# Patient Record
Sex: Female | Born: 1965 | Race: White | Hispanic: No | Smoking: Former smoker
Health system: Southern US, Community
[De-identification: ages and names within clinical notes are randomized; demographics above are authoritative.]

## PROBLEM LIST (undated history)

## (undated) DIAGNOSIS — I10 Essential (primary) hypertension: Secondary | ICD-10-CM

## (undated) DIAGNOSIS — M109 Gout, unspecified: Secondary | ICD-10-CM

---

## 2006-01-14 ENCOUNTER — Emergency Department (HOSPITAL_COMMUNITY): Admission: EM | Admit: 2006-01-14 | Discharge: 2006-01-14 | Payer: Self-pay | Admitting: *Deleted

## 2006-02-12 ENCOUNTER — Emergency Department (HOSPITAL_COMMUNITY): Admission: EM | Admit: 2006-02-12 | Discharge: 2006-02-13 | Payer: Self-pay | Admitting: Emergency Medicine

## 2007-01-04 ENCOUNTER — Emergency Department (HOSPITAL_COMMUNITY): Admission: EM | Admit: 2007-01-04 | Discharge: 2007-01-04 | Payer: Self-pay | Admitting: Emergency Medicine

## 2007-11-05 ENCOUNTER — Emergency Department (HOSPITAL_BASED_OUTPATIENT_CLINIC_OR_DEPARTMENT_OTHER): Admission: EM | Admit: 2007-11-05 | Discharge: 2007-11-05 | Payer: Self-pay | Admitting: Emergency Medicine

## 2008-01-18 ENCOUNTER — Emergency Department (HOSPITAL_BASED_OUTPATIENT_CLINIC_OR_DEPARTMENT_OTHER): Admission: EM | Admit: 2008-01-18 | Discharge: 2008-01-18 | Payer: Self-pay | Admitting: Emergency Medicine

## 2008-01-29 ENCOUNTER — Emergency Department (HOSPITAL_BASED_OUTPATIENT_CLINIC_OR_DEPARTMENT_OTHER): Admission: EM | Admit: 2008-01-29 | Discharge: 2008-01-29 | Payer: Self-pay | Admitting: Emergency Medicine

## 2009-04-14 ENCOUNTER — Ambulatory Visit (HOSPITAL_COMMUNITY): Admission: RE | Admit: 2009-04-14 | Discharge: 2009-04-14 | Payer: Self-pay | Admitting: Obstetrics and Gynecology

## 2009-07-08 ENCOUNTER — Emergency Department (HOSPITAL_BASED_OUTPATIENT_CLINIC_OR_DEPARTMENT_OTHER): Admission: EM | Admit: 2009-07-08 | Discharge: 2009-07-08 | Payer: Self-pay | Admitting: Emergency Medicine

## 2011-02-17 LAB — COMPREHENSIVE METABOLIC PANEL
Alkaline Phosphatase: 66
BUN: 8
Calcium: 10.1
Creatinine, Ser: 0.7
Glucose, Bld: 88
Potassium: 3.6
Total Protein: 9 — ABNORMAL HIGH

## 2011-02-17 LAB — LIPASE, BLOOD: Lipase: 77

## 2011-02-17 LAB — CBC
HCT: 32.8 — ABNORMAL LOW
Hemoglobin: 10.2 — ABNORMAL LOW
MCHC: 31
MCV: 75.1 — ABNORMAL LOW
Platelets: 595 — ABNORMAL HIGH
RDW: 17.3 — ABNORMAL HIGH

## 2011-02-17 LAB — DIFFERENTIAL
Basophils Relative: 1
Lymphocytes Relative: 33
Monocytes Relative: 7
Neutro Abs: 6.3
Neutrophils Relative %: 58

## 2011-02-26 LAB — CBC
HCT: 25.7 — ABNORMAL LOW
Platelets: 405 — ABNORMAL HIGH
RBC: 3.54 — ABNORMAL LOW
WBC: 10

## 2011-02-26 LAB — URINALYSIS, ROUTINE W REFLEX MICROSCOPIC
Glucose, UA: NEGATIVE
pH: 6

## 2011-02-26 LAB — I-STAT 8, (EC8 V) (CONVERTED LAB)
BUN: 5 — ABNORMAL LOW
Chloride: 102
Glucose, Bld: 96
HCT: 30 — ABNORMAL LOW
pCO2, Ven: 33.7 — ABNORMAL LOW
pH, Ven: 7.441 — ABNORMAL HIGH

## 2011-02-26 LAB — POCT CARDIAC MARKERS
CKMB, poc: <1 — ABNORMAL LOW
Myoglobin, poc: 35.3
Myoglobin, poc: 41.3
Operator id: 272551
Operator id: 272551
Troponin i, poc: <0.05

## 2011-02-26 LAB — DIFFERENTIAL
Lymphocytes Relative: 33
Lymphs Abs: 3.3
Neutrophils Relative %: 50

## 2011-02-26 LAB — OCCULT BLOOD X 1 CARD TO LAB, STOOL: Fecal Occult Bld: NEGATIVE

## 2011-02-26 LAB — PREGNANCY, URINE: Preg Test, Ur: NEGATIVE

## 2014-03-31 ENCOUNTER — Emergency Department (HOSPITAL_BASED_OUTPATIENT_CLINIC_OR_DEPARTMENT_OTHER)
Admission: EM | Admit: 2014-03-31 | Discharge: 2014-03-31 | Disposition: A | Discharge status: Home / Self Care | Payer: Self-pay | Attending: Emergency Medicine | Admitting: Emergency Medicine

## 2014-03-31 ENCOUNTER — Encounter (HOSPITAL_BASED_OUTPATIENT_CLINIC_OR_DEPARTMENT_OTHER): Payer: Self-pay | Admitting: Emergency Medicine

## 2014-03-31 ENCOUNTER — Emergency Department (HOSPITAL_BASED_OUTPATIENT_CLINIC_OR_DEPARTMENT_OTHER): Payer: Self-pay

## 2014-03-31 DIAGNOSIS — R059 Cough, unspecified: Secondary | ICD-10-CM

## 2014-03-31 DIAGNOSIS — J209 Acute bronchitis, unspecified: Secondary | ICD-10-CM | POA: Insufficient documentation

## 2014-03-31 DIAGNOSIS — Z87891 Personal history of nicotine dependence: Secondary | ICD-10-CM | POA: Insufficient documentation

## 2014-03-31 DIAGNOSIS — Z79899 Other long term (current) drug therapy: Secondary | ICD-10-CM | POA: Insufficient documentation

## 2014-03-31 DIAGNOSIS — J40 Bronchitis, not specified as acute or chronic: Secondary | ICD-10-CM

## 2014-03-31 DIAGNOSIS — R05 Cough: Secondary | ICD-10-CM

## 2014-03-31 DIAGNOSIS — I10 Essential (primary) hypertension: Secondary | ICD-10-CM | POA: Insufficient documentation

## 2014-03-31 HISTORY — DX: Essential (primary) hypertension: I10

## 2014-03-31 LAB — CBC WITH DIFFERENTIAL/PLATELET
BASOS ABS: 0.1 10*3/uL (ref 0.0–0.1)
BASOS PCT: 1 % (ref 0–1)
EOS ABS: 0.5 10*3/uL (ref 0.0–0.7)
Eosinophils Relative: 5 % (ref 0–5)
HCT: 32.4 % — ABNORMAL LOW (ref 36.0–46.0)
HEMOGLOBIN: 10.3 g/dL — AB (ref 12.0–15.0)
Lymphocytes Relative: 33 % (ref 12–46)
Lymphs Abs: 3 10*3/uL (ref 0.7–4.0)
MCH: 27.2 pg (ref 26.0–34.0)
MCHC: 31.8 g/dL (ref 30.0–36.0)
MCV: 85.5 fL (ref 78.0–100.0)
Monocytes Absolute: 0.9 10*3/uL (ref 0.1–1.0)
Monocytes Relative: 9 % (ref 3–12)
NEUTROS PCT: 52 % (ref 43–77)
Neutro Abs: 4.7 10*3/uL (ref 1.7–7.7)
Platelets: 327 10*3/uL (ref 150–400)
RBC: 3.79 MIL/uL — ABNORMAL LOW (ref 3.87–5.11)
RDW: 15.9 % — AB (ref 11.5–15.5)
WBC: 9.1 10*3/uL (ref 4.0–10.5)

## 2014-03-31 LAB — BASIC METABOLIC PANEL
ANION GAP: 18 — AB (ref 5–15)
BUN: 13 mg/dL (ref 6–23)
CO2: 26 mEq/L (ref 19–32)
Calcium: 9.5 mg/dL (ref 8.4–10.5)
Chloride: 96 mEq/L (ref 96–112)
Creatinine, Ser: 0.6 mg/dL (ref 0.50–1.10)
Glucose, Bld: 124 mg/dL — ABNORMAL HIGH (ref 70–99)
POTASSIUM: 3.4 meq/L — AB (ref 3.7–5.3)
SODIUM: 140 meq/L (ref 137–147)

## 2014-03-31 LAB — TROPONIN I

## 2014-03-31 LAB — D-DIMER, QUANTITATIVE: D-Dimer, Quant: <0.27 ug/mL-FEU (ref 0.00–0.48)

## 2014-03-31 MED ORDER — HYDROCODONE-HOMATROPINE 5-1.5 MG/5ML PO SYRP: 5.0000 mL | ORAL_SOLUTION | Freq: Four times a day (QID) | ORAL | Status: AC | PRN

## 2014-03-31 MED ORDER — AZITHROMYCIN 250 MG PO TABS: 250.0000 mg | ORAL_TABLET | Freq: Every day | ORAL | Status: AC

## 2014-03-31 MED ORDER — HYDROCODONE-HOMATROPINE 5-1.5 MG/5ML PO SYRP: 5.0000 mL | ORAL_SOLUTION | Freq: Four times a day (QID) | ORAL | Status: DC | PRN

## 2014-03-31 MED ORDER — AZITHROMYCIN 250 MG PO TABS: 250.0000 mg | ORAL_TABLET | Freq: Every day | ORAL | Status: DC

## 2014-03-31 MED ORDER — AZITHROMYCIN 250 MG PO TABS
500.0000 mg | ORAL_TABLET | Freq: Once | ORAL | Status: AC
  Administered 2014-03-31: 500 mg via ORAL
  Filled 2014-03-31: qty 2

## 2014-03-31 NOTE — Discharge Instructions (Signed)
Take azithromycin as directed until gone. Take hycodan as needed for cough. Refer to attached documents for more information. Return to the ED with worsening or concerning symptoms.

## 2014-03-31 NOTE — ED Provider Notes (Signed)
CSN: [id number]     Arrival date & time 03/31/14  1927 History   First MD Initiated Contact with Patient 03/31/14 2002     Chief Complaint  Patient presents with  . Cough  . Fever  . Generalized Body Aches     (Consider location/radiation/quality/duration/timing/severity/associated sxs/prior Treatment) HPI Comments: Patient is a 48 year old female with a past medical history of hypertension who presents with a 1 week history of cough. Symptoms started gradually and progressively worsened since the onset. The cough is productive with thick green sputum and sometime "blood clots." Patient reports associated body aches, shortness of breath, subjective fever, and a sharp pain in the left chest. The chest pain does not radiate and is made worse with deep inspiration. She has tried OTC medications without relief. No known sick contacts. Patient denies previous DVT/PE, exogenous estrogen, recent travel/surgery.    Past Medical History  Diagnosis Date  . Hypertension    History reviewed. No pertinent past surgical history. No family history on file. History  Substance Use Topics  . Smoking status: Former Games developermoker  . Smokeless tobacco: Not on file  . Alcohol Use: Not on file   OB History    No data available     Review of Systems  Constitutional: Negative for fever, chills and fatigue.  HENT: Positive for congestion. Negative for trouble swallowing.   Eyes: Negative for visual disturbance.  Respiratory: Positive for cough and shortness of breath.   Cardiovascular: Positive for chest pain. Negative for palpitations.  Gastrointestinal: Negative for nausea, vomiting, abdominal pain and diarrhea.  Genitourinary: Negative for dysuria and difficulty urinating.  Musculoskeletal: Negative for arthralgias and neck pain.  Skin: Negative for color change.  Neurological: Negative for dizziness and weakness.  Psychiatric/Behavioral: Negative for dysphoric mood.      Allergies  Review of  patient's allergies indicates no known allergies.  Home Medications   Prior to Admission medications   Medication Sig Start Date End Date Taking? Authorizing Provider  lisinopril-hydrochlorothiazide (PRINZIDE,ZESTORETIC) 20-25 MG per tablet Take 1 tablet by mouth daily.   Yes Historical Provider, MD   BP 134/68 mmHg  Pulse 90  Temp(Src) 98.3 F (36.8 C) (Oral)  Resp 20  Ht 5\' 3"  (1.6 m)  Wt 208 lb (94.348 kg)  BMI 36.85 kg/m2  SpO2 97%  LMP 03/24/2014 (Exact Date) Physical Exam  Constitutional: She is oriented to person, place, and time. She appears well-developed and well-nourished. No distress.  Patient coughing throughout exam.   HENT:  Head: Normocephalic and atraumatic.  Mouth/Throat: Oropharynx is clear and moist. No oropharyngeal exudate.  Eyes: Conjunctivae and EOM are normal.  Neck: Normal range of motion.  Cardiovascular: Normal rate and regular rhythm.  Exam reveals no gallop and no friction rub.   No murmur heard. Pulmonary/Chest: Effort normal and breath sounds normal. She has no wheezes. She has no rales. She exhibits no tenderness.  Abdominal: Soft. She exhibits no distension. There is no tenderness. There is no rebound.  Musculoskeletal: Normal range of motion.  Neurological: She is alert and oriented to person, place, and time. Coordination normal.  Speech is goal-oriented. Moves limbs without ataxia.   Skin: Skin is warm and dry.  Psychiatric: She has a normal mood and affect. Her behavior is normal.  Nursing note and vitals reviewed.   ED Course  Procedures (including critical care time) Labs Review Labs Reviewed  CBC WITH DIFFERENTIAL - Abnormal; Notable for the following:    RBC 3.79 (*)  Hemoglobin 10.3 (*)    HCT 32.4 (*)    RDW 15.9 (*)    All other components within normal limits  BASIC METABOLIC PANEL - Abnormal; Notable for the following:    Potassium 3.4 (*)    Glucose, Bld 124 (*)    Anion gap 18 (*)    All other components within  normal limits  D-DIMER, QUANTITATIVE  TROPONIN I    Imaging Review Dg Chest 2 View  03/31/2014   CLINICAL DATA:  Cough. Fever. Body aches. Chest wall pain. LEFT-sided chest pain. Initial encounter. Symptoms for 1 week.  EXAM: CHEST  2 VIEW  COMPARISON:  01/29/2008.  FINDINGS: Cardiopericardial silhouette within normal limits. Mediastinal contours normal. Trachea midline. No airspace disease or effusion.  IMPRESSION: No active cardiopulmonary disease.   Electronically Signed   By: Andreas NewportGeoffrey  Lamke M.D.   On: 03/31/2014 20:08     EKG Interpretation   Date/Time:  Sunday March 31 2014 21:00:34 EST Ventricular Rate:  96 PR Interval:  130 QRS Duration: 76 QT Interval:  356 QTC Calculation: 449 R Axis:   71 Text Interpretation:  Normal sinus rhythm Normal ECG waveform artifact  Confirmed by BELFI  MD, MELANIE ([id number]) on 03/31/2014 10:22:25 PM      MDM   Final diagnoses:  Bronchitis   10:37 PM Patient's labs and chest xray unremarkable for acute changes. Patient's d-dimer is negative. Vitals stable and patient afebrile. Patient was able to ambulate without difficulty with oxygen saturation in the mid 90's. Patient will be discharged with hycodan and azithromycin.    Emilia BeckKaitlyn Shyne Resch, PA-C 03/31/14 2247  Rolan BuccoMelanie Belfi, MD 03/31/14 2342

## 2014-03-31 NOTE — ED Notes (Signed)
Pt presents to ED with complaints of fever, cough, body aches, nose bleeds, and chest wall pain under left breast pt reports all these symptoms for the past week.

## 2014-03-31 NOTE — ED Notes (Addendum)
I ambulated patient with portable pulse ox. Results of 95 % during ambulation, then 97% at rest from wall monitor after brisk walk.

## 2016-10-11 ENCOUNTER — Emergency Department (HOSPITAL_COMMUNITY)
Admission: EM | Admit: 2016-10-11 | Discharge: 2016-10-11 | Disposition: A | Discharge status: Home / Self Care | Payer: Self-pay | Attending: Emergency Medicine | Admitting: Emergency Medicine

## 2016-10-11 ENCOUNTER — Encounter (HOSPITAL_COMMUNITY): Payer: Self-pay | Admitting: Nurse Practitioner

## 2016-10-11 ENCOUNTER — Emergency Department (HOSPITAL_COMMUNITY): Payer: Self-pay

## 2016-10-11 DIAGNOSIS — Y9239 Other specified sports and athletic area as the place of occurrence of the external cause: Secondary | ICD-10-CM | POA: Insufficient documentation

## 2016-10-11 DIAGNOSIS — F17200 Nicotine dependence, unspecified, uncomplicated: Secondary | ICD-10-CM | POA: Insufficient documentation

## 2016-10-11 DIAGNOSIS — S91112A Laceration without foreign body of left great toe without damage to nail, initial encounter: Secondary | ICD-10-CM | POA: Insufficient documentation

## 2016-10-11 DIAGNOSIS — S93401A Sprain of unspecified ligament of right ankle, initial encounter: Secondary | ICD-10-CM

## 2016-10-11 DIAGNOSIS — S93402A Sprain of unspecified ligament of left ankle, initial encounter: Secondary | ICD-10-CM | POA: Insufficient documentation

## 2016-10-11 DIAGNOSIS — Y999 Unspecified external cause status: Secondary | ICD-10-CM | POA: Insufficient documentation

## 2016-10-11 DIAGNOSIS — S91112S Laceration without foreign body of left great toe without damage to nail, sequela: Secondary | ICD-10-CM

## 2016-10-11 DIAGNOSIS — Y939 Activity, unspecified: Secondary | ICD-10-CM | POA: Insufficient documentation

## 2016-10-11 DIAGNOSIS — W01118A Fall on same level from slipping, tripping and stumbling with subsequent striking against other sharp object, initial encounter: Secondary | ICD-10-CM | POA: Insufficient documentation

## 2016-10-11 DIAGNOSIS — Z23 Encounter for immunization: Secondary | ICD-10-CM | POA: Insufficient documentation

## 2016-10-11 MED ORDER — TETANUS-DIPHTH-ACELL PERTUSSIS 5-2.5-18.5 LF-MCG/0.5 IM SUSP
0.5000 mL | Freq: Once | INTRAMUSCULAR | Status: AC
  Administered 2016-10-11: 0.5 mL via INTRAMUSCULAR
  Filled 2016-10-11: qty 0.5

## 2016-10-11 MED ORDER — CIPROFLOXACIN HCL 250 MG PO TABS: 250.0000 mg | ORAL_TABLET | Freq: Two times a day (BID) | ORAL | 0 refills | Status: AC

## 2016-10-11 MED ORDER — BACITRACIN ZINC 500 UNIT/GM EX OINT
TOPICAL_OINTMENT | CUTANEOUS | Status: AC
  Administered 2016-10-11: 1
  Filled 2016-10-11: qty 1.8

## 2016-10-11 MED ORDER — CIPROFLOXACIN HCL 500 MG PO TABS
250.0000 mg | ORAL_TABLET | Freq: Once | ORAL | Status: AC
  Administered 2016-10-11: 250 mg via ORAL
  Filled 2016-10-11: qty 1

## 2016-10-11 NOTE — ED Provider Notes (Signed)
[facility]-EMERGENCY DEPT Provider Note   CSN: [id number] Arrival date & time: 10/11/16  4098   By signing my name below, I, Clarisse Gouge, attest that this documentation has been prepared under the direction and in the presence of Arnold Kester, MD. Electronically signed, Clarisse Gouge, ED Scribe. 10/11/16. 2:52 AM.   History   Chief Complaint Chief Complaint  Patient presents with  . Laceration    Left big toe  . Ankle Injury    Right   The history is provided by the patient and medical records. No language interpreter was used.  Laceration   The incident occurred 3 to 5 hours ago. The laceration is located on the left foot. The laceration is 1 cm (< 1 cm) in size. The laceration mechanism was a a metal edge. The pain is mild. The pain has been improving since onset. She reports no foreign bodies present. Her tetanus status is out of date.  Ankle Injury  This is a new problem. The current episode started 3 to 5 hours ago. The problem occurs constantly. The problem has been gradually improving. The symptoms are aggravated by walking. The symptoms are relieved by rest.    Jessica Wiley is a 51 y.o. female who presents to the Emergency Department with concern for a L great toe laceration that she sustained ~3.5 hours ago. She states she hit her foot and tripped over a metal lid at a race car track. Bleeding controlled with a Band-Aid on evaluation. Pt adds she also hurt her ankle at this time and reports L ankle pain. She described 2/10 constant aches decreased from 7/10 pain on arrival. No other trauma noted. No other complaints at this time. Last tetanus within 10 years, reportedly.  Past Medical History:  Diagnosis Date  . Hypertension     There are no active problems to display for this patient.   History reviewed. No pertinent surgical history.  OB History    No data available       Home Medications    Prior to Admission medications   Medication Sig Start Date End Date  Taking? Authorizing Provider  azithromycin (ZITHROMAX Z-PAK) 250 MG tablet Take 1 tablet (250 mg total) by mouth daily. 500mg  PO day 1, then 250mg  PO days 2-5 03/31/14   Szekalski, [place], PA-C  HYDROcodone-homatropine [facility]) 5-1.5 MG/5ML syrup Take 5 mLs by mouth every 6 (six) hours as needed. 03/31/14   Emilia Beck, PA-C  lisinopril-hydrochlorothiazide (PRINZIDE,ZESTORETIC) 20-25 MG per tablet Take 1 tablet by mouth daily.    [provider]    Family History No family history on file.  Social History Social History  Substance Use Topics  . Smoking status: Former Games developer  . Smokeless tobacco: Not on file  . Alcohol use Not on file     Allergies   Patient has no known allergies.   Review of Systems Review of Systems  Musculoskeletal: Positive for arthralgias. Negative for joint swelling.  Skin: Positive for wound.  All other systems reviewed and are negative.    Physical Exam Updated Vital Signs BP (!) 156/84 (BP Location: Right Arm)   Pulse 78   Temp 98.4 F (36.9 C) (Oral)   Resp 20   SpO2 100%   Physical Exam  Constitutional: She is oriented to person, place, and time. She appears well-developed and well-nourished. No distress.  HENT:  Head: Normocephalic and atraumatic.  Right Ear: External ear normal.  Left Ear: External ear normal.  Nose: Nose normal.  Mouth/Throat:  Oropharynx is clear and moist and mucous membranes are normal. No oropharyngeal exudate.  Eyes: Conjunctivae are normal. Pupils are equal, round, and reactive to light.  Neck: Normal range of motion. Neck supple. No JVD present. Carotid bruit is not present.  Cardiovascular: Normal rate, regular rhythm and intact distal pulses.   Pulmonary/Chest: Effort normal and breath sounds normal. No stridor. She has no wheezes. She has no rales.  Abdominal: Soft. Bowel sounds are normal. She exhibits no fluid wave and no mass. There is no tenderness. There is no guarding.    Musculoskeletal: Normal range of motion. She exhibits no edema or deformity.  Base of the L great toe with < 1 cm superficial flap laceration. Cap refill < 2 seconds. Achilles intact. All ligaments intact. 3+ D/P pulse.  Neurological: She is alert and oriented to person, place, and time. She displays normal reflexes.  Skin: Skin is warm and dry. Capillary refill takes less than 2 seconds.  Psychiatric: She has a normal mood and affect.  Nursing note and vitals reviewed.    ED Treatments / Results  DIAGNOSTIC STUDIES: Oxygen Saturation is 100% on RA, NL by my interpretation.    COORDINATION OF CARE: 2:43 AM-Discussed next steps with pt. Pt verbalized understanding and is agreeable with the plan. Will order medications and refer to foot specialist.   Labs (all labs ordered are listed, but only abnormal results are displayed) Labs Reviewed - No data to display  EKG  EKG Interpretation None       Radiology Dg Ankle Complete Right  Result Date: 10/11/2016 CLINICAL DATA:  51 year old female with right ankle injury. EXAM: RIGHT ANKLE - COMPLETE 3+ VIEW COMPARISON:  None FINDINGS: There is no acute fracture or dislocation. There is chronic changes and bone spurring at the talofibular ligament. There is mild soft tissue swelling of the ankle. No radiopaque foreign object. IMPRESSION: 1. No acute fracture or dislocation. 2. Mild soft tissue swelling of the ankle. 3. Chronic changes and spurring along the talofibular ligament. Electronically Signed   By: Elgie CollardArash  Radparvar M.D.   On: 10/11/2016 01:52    Procedures .Marland Kitchen.Laceration Repair Date/Time: 10/11/2016 2:40 AM Performed by: Cy BlamerPALUMBO, Mahayla Haddaway Authorized by: Cy BlamerPALUMBO, Pakou Rainbow   Consent:    Consent obtained:  Verbal   Consent given by:  Patient   Risks discussed:  Poor wound healing and infection   Alternatives discussed:  No treatment Anesthesia (see MAR for exact dosages):    Anesthesia method:  None Laceration details:    Location:   Toe   Toe location:  L big toe   Length (cm):  0.9 (< 1 cm)   Depth (mm):  1 (superficial) Repair type:    Repair type:  Simple Pre-procedure details:    Preparation:  Imaging obtained to evaluate for foreign bodies Exploration:    Hemostasis achieved with:  Direct pressure   Wound extent: no areolar tissue violation noted, no fascia violation noted, no foreign bodies/material noted and no muscle damage noted     Contaminated: no   Treatment:    Wound cleansed with: chlorhexadine.   Amount of cleaning:  Standard Skin repair:    Repair method:  Tissue adhesive Approximation:    Approximation:  Close Post-procedure details:    Dressing:  Open (no dressing)   Patient tolerance of procedure:  Tolerated well, no immediate complications    (including critical care time)  Medications Ordered in ED Medications  Tdap (BOOSTRIX) injection 0.5 mL (0.5 mLs Intramuscular Given 10/11/16 0202)  bacitracin 500 UNIT/GM ointment (1 application  Given 10/11/16 0204)  ciprofloxacin (CIPRO) tablet 250 mg (250 mg Oral Given 10/11/16 0245)     Final Clinical Impressions(s) / ED Diagnoses  Ankle sprain and toe laceration Return immediately for fever >101, swelling, streaking up the leg, intractable pain, weakness, bleeding or any concerns. Follow up with your own doctor for ongoing concerns.   The patient is nontoxic-appearing on exam and vital signs are within normal limits.   I have reviewed the triage vital signs and the nursing notes. Pertinent labs &imaging results that were available during my care of the patient were reviewed by me and considered in my medical decision making (see chart for details).  After history, exam, and medical workup I feel the patient has been appropriately medically screened and is safe for discharge home. Pertinent diagnoses were discussed with the patient. Patient was given return precautions.    I personally performed the services described in this  documentation, which was scribed in my presence. The recorded information has been reviewed and is accurate.     Cresencia Asmus, MD 10/11/16 6045

## 2016-10-11 NOTE — ED Triage Notes (Signed)
Pt states that today while at the car race track, she "had a pretty good fall" down a small hill. She presents with a left big toe laceration with scant controlled bleeding and right ankle pain. States she isn't up-to-date with tetanus shot.

## 2018-04-20 ENCOUNTER — Emergency Department (HOSPITAL_BASED_OUTPATIENT_CLINIC_OR_DEPARTMENT_OTHER)
Admission: EM | Admit: 2018-04-20 | Discharge: 2018-04-20 | Disposition: A | Discharge status: Home / Self Care | Payer: Self-pay | Attending: Emergency Medicine | Admitting: Emergency Medicine

## 2018-04-20 ENCOUNTER — Other Ambulatory Visit: Payer: Self-pay

## 2018-04-20 ENCOUNTER — Encounter (HOSPITAL_BASED_OUTPATIENT_CLINIC_OR_DEPARTMENT_OTHER): Payer: Self-pay

## 2018-04-20 ENCOUNTER — Emergency Department (HOSPITAL_BASED_OUTPATIENT_CLINIC_OR_DEPARTMENT_OTHER): Payer: Self-pay

## 2018-04-20 DIAGNOSIS — Z87891 Personal history of nicotine dependence: Secondary | ICD-10-CM | POA: Insufficient documentation

## 2018-04-20 DIAGNOSIS — M25562 Pain in left knee: Secondary | ICD-10-CM | POA: Insufficient documentation

## 2018-04-20 DIAGNOSIS — I10 Essential (primary) hypertension: Secondary | ICD-10-CM | POA: Insufficient documentation

## 2018-04-20 HISTORY — DX: Gout, unspecified: M10.9

## 2018-04-20 MED ORDER — KETOROLAC TROMETHAMINE 30 MG/ML IJ SOLN
30.0000 mg | Freq: Once | INTRAMUSCULAR | Status: AC
  Administered 2018-04-20: 30 mg via INTRAMUSCULAR
  Filled 2018-04-20: qty 1

## 2018-04-20 MED ORDER — PREDNISONE 20 MG PO TABS: 40.0000 mg | ORAL_TABLET | Freq: Every day | ORAL | 0 refills | Status: AC

## 2018-04-20 NOTE — ED Provider Notes (Signed)
[facility] EMERGENCY DEPARTMENT Provider Note   CSN: [id number] Arrival date & time: 04/20/18  2022     History   Chief Complaint Chief Complaint  Patient presents with  . Knee Pain    HPI Jessica Wiley is a 52 y.o. female.  52 y/o female with a PMH of HTN, Gout presents to the ED with a chief complaint of left knee pain x below weeks.  Patient reports she currently works standing 9 hours a day and her pain has worsened after a day of work.  She reports the pain is mainly on the left knee describes it as aching and pressure worse with walking.  Ports she has taken some indomethacin along with allopurinol but reports no improvement in symptoms.  Patient states she has a previous history of of gout but she is currently self-medicating and self treating as she is never had any uric acid levels drawn or had a PCP appointment.  She is currently taking medications from a friend.  Reports having some beers today to help with the symptoms.  Denies any trauma, fever, previous history of clots, calf tenderness or swelling, shortness of breath or other complaints.     Past Medical History:  Diagnosis Date  . Gout   . Hypertension     There are no active problems to display for this patient.   History reviewed. No pertinent surgical history.   OB History   None      Home Medications    Prior to Admission medications   Medication Sig Start Date End Date Taking? Authorizing Provider  azithromycin (ZITHROMAX Z-PAK) 250 MG tablet Take 1 tablet (250 mg total) by mouth daily. 500mg  PO day 1, then 250mg  PO days 2-5 03/31/14   Szekalski, WeirKaitlyn, PA-C  ciprofloxacin (CIPRO) 250 MG tablet Take 1 tablet (250 mg total) by mouth 2 (two) times daily. 10/11/16   Palumbo, April, MD  HYDROcodone-homatropine [facility](HYCODAN) 5-1.5 MG/5ML syrup Take 5 mLs by mouth every 6 (six) hours as needed. 03/31/14   Emilia BeckSzekalski, Kaitlyn, PA-C  lisinopril-hydrochlorothiazide (PRINZIDE,ZESTORETIC) 20-25 MG  per tablet Take 1 tablet by mouth daily.    [provider]  predniSONE (DELTASONE) 20 MG tablet Take 2 tablets (40 mg total) by mouth daily for 5 days. 04/20/18 04/25/18  Claude MangesSoto, Koree Schopf, PA-C    Family History No family history on file.  Social History Social History   Tobacco Use  . Smoking status: Former Games developermoker  . Smokeless tobacco: Never Used  Substance Use Topics  . Alcohol use: Yes    Comment: daily  . Drug use: Never     Allergies   Patient has no known allergies.   Review of Systems Review of Systems  Constitutional: Negative for fever.  Respiratory: Negative for shortness of breath.   Cardiovascular: Negative for chest pain.  Musculoskeletal: Positive for arthralgias.     Physical Exam Updated Vital Signs BP (!) 152/77 (BP Location: Right Arm)   Pulse 92   Temp 98.2 F (36.8 C) (Oral)   Resp 18   Ht 5\' 3"  (1.6 m)   Wt 95.3 kg   SpO2 97%   BMI 37.20 kg/m   Physical Exam  Constitutional: She is oriented to person, place, and time. She appears well-developed and well-nourished. No distress.  HENT:  Head: Normocephalic and atraumatic.  Mouth/Throat: Oropharynx is clear and moist. No oropharyngeal exudate.  Eyes: Pupils are equal, round, and reactive to light.  Neck: Normal range of motion.  Cardiovascular: Regular  rhythm and normal heart sounds.  Pulmonary/Chest: Effort normal and breath sounds normal. No respiratory distress.  Abdominal: Soft. Bowel sounds are normal. She exhibits no distension. There is no tenderness.  Musculoskeletal: She exhibits no tenderness or deformity.       Left knee: She exhibits swelling. She exhibits normal range of motion, no laceration, no erythema and normal alignment.       Right lower leg: She exhibits no edema.       Left lower leg: She exhibits no edema.       Legs: Tenderness to palpation above the patella, no laxity.  Pulses are present, 5 out of 5 strength with dorsiflexion and plantarflexion.  Calf  tenderness, swelling.  Neurological: She is alert and oriented to person, place, and time.  Skin: Skin is warm and dry.  Psychiatric: She has a normal mood and affect.  Nursing note and vitals reviewed.    ED Treatments / Results  Labs (all labs ordered are listed, but only abnormal results are displayed) Labs Reviewed - No data to display  EKG None  Radiology Dg Knee 2 Views Left  Result Date: 04/20/2018 CLINICAL DATA:  Bilateral knee pain for the past month, greater on the left. Some swelling. No known injury. EXAM: LEFT KNEE - 1-2 VIEW COMPARISON:  None. FINDINGS: Minimal posterior patellar spur formation. Mild to moderate anterior patellar enthesophytes. No effusion. IMPRESSION: Minimal degenerative changes. Electronically Signed   By: Beckie Salts M.D.   On: 04/20/2018 22:17    Procedures Procedures (including critical care time)  Medications Ordered in ED Medications  ketorolac (TORADOL) 30 MG/ML injection 30 mg (30 mg Intramuscular Given 04/20/18 2132)     Initial Impression / Assessment and Plan / ED Course  I have reviewed the triage vital signs and the nursing notes.  Pertinent labs & imaging results that were available during my care of the patient were reviewed by me and considered in my medical decision making (see chart for details).    Presents with left knee pain which has been going on for a few weeks.  She currently self medicates for gout with indomethacin and allopurinol which she obtains from her friend.  She reports this pain is worse with ambulation along with worse with palpation on the left knee.  She denies any fevers or chills or other complaints at this time.  Patient currently stands 10+ hours on her feet are concrete.  DG left knee showed mild degenerative changes. No acute x-ray or dislocation.  Given IM Toradol in the ED for pain.  I have advised patient that I will provide a referral to the community health and wellness to follow-up and obtain  establish primary care follow-up.  I will also provide her with a steroid burst in order to help with her symptoms.  Patient understands and agrees with management.  I also provide a work note for her to take the weekend off and go back on Monday.  Return precautions provided.   Final Clinical Impressions(s) / ED Diagnoses   Final diagnoses:  Acute pain of left knee    ED Discharge Orders         Ordered    predniSONE (DELTASONE) 20 MG tablet  Daily     04/20/18 2241           Claude Manges, PA-C 04/20/18 2253    Mesner, Barbara Cower, MD 04/21/18 0105

## 2018-04-20 NOTE — ED Triage Notes (Signed)
C/o bilat knee pain x 1 month-left worse than right-hx of gout-states pain feels worse that gout-denies injury-to triage in w/c

## 2018-04-20 NOTE — Discharge Instructions (Signed)
I have prescribed steroids to treat your pain, be advised this medication can cause insomnia, appetite changes. Please follow-up with PCP in 1 week for reevaluation of your symptoms.

## 2018-04-20 NOTE — ED Notes (Signed)
Patient transported to X-ray 

## 2019-04-15 ENCOUNTER — Encounter (HOSPITAL_BASED_OUTPATIENT_CLINIC_OR_DEPARTMENT_OTHER): Payer: Self-pay | Admitting: Emergency Medicine

## 2019-04-15 ENCOUNTER — Other Ambulatory Visit: Payer: Self-pay

## 2019-04-15 ENCOUNTER — Emergency Department (HOSPITAL_BASED_OUTPATIENT_CLINIC_OR_DEPARTMENT_OTHER): Payer: Self-pay

## 2019-04-15 ENCOUNTER — Emergency Department (HOSPITAL_BASED_OUTPATIENT_CLINIC_OR_DEPARTMENT_OTHER)
Admission: EM | Admit: 2019-04-15 | Discharge: 2019-04-16 | Disposition: A | Discharge status: Home / Self Care | Payer: Self-pay | Attending: Emergency Medicine | Admitting: Emergency Medicine

## 2019-04-15 DIAGNOSIS — R062 Wheezing: Secondary | ICD-10-CM | POA: Insufficient documentation

## 2019-04-15 DIAGNOSIS — Z79899 Other long term (current) drug therapy: Secondary | ICD-10-CM | POA: Insufficient documentation

## 2019-04-15 DIAGNOSIS — J40 Bronchitis, not specified as acute or chronic: Secondary | ICD-10-CM | POA: Insufficient documentation

## 2019-04-15 DIAGNOSIS — Z20828 Contact with and (suspected) exposure to other viral communicable diseases: Secondary | ICD-10-CM | POA: Insufficient documentation

## 2019-04-15 DIAGNOSIS — R059 Cough, unspecified: Secondary | ICD-10-CM

## 2019-04-15 DIAGNOSIS — R05 Cough: Secondary | ICD-10-CM

## 2019-04-15 DIAGNOSIS — I10 Essential (primary) hypertension: Secondary | ICD-10-CM | POA: Insufficient documentation

## 2019-04-15 DIAGNOSIS — Z87891 Personal history of nicotine dependence: Secondary | ICD-10-CM | POA: Insufficient documentation

## 2019-04-15 LAB — BASIC METABOLIC PANEL
Anion gap: 9 (ref 5–15)
BUN: 16 mg/dL (ref 6–20)
CO2: 30 mmol/L (ref 22–32)
Calcium: 10 mg/dL (ref 8.9–10.3)
Chloride: 100 mmol/L (ref 98–111)
Creatinine, Ser: 0.68 mg/dL (ref 0.44–1.00)
GFR calc Af Amer: >60 mL/min (ref >60)
GFR calc non Af Amer: >60 mL/min (ref >60)
Glucose, Bld: 174 mg/dL — ABNORMAL HIGH (ref 70–99)
Potassium: 3.6 mmol/L (ref 3.5–5.1)
Sodium: 139 mmol/L (ref 135–145)

## 2019-04-15 LAB — CBC WITH DIFFERENTIAL/PLATELET
Abs Immature Granulocytes: 0.05 10*3/uL (ref 0.00–0.07)
Basophils Absolute: 0.1 10*3/uL (ref 0.0–0.1)
Basophils Relative: 1 %
Eosinophils Absolute: 0.4 10*3/uL (ref 0.0–0.5)
Eosinophils Relative: 5 %
HCT: 42.8 % (ref 36.0–46.0)
Hemoglobin: 13.2 g/dL (ref 12.0–15.0)
Immature Granulocytes: 1 %
Lymphocytes Relative: 41 %
Lymphs Abs: 3.1 10*3/uL (ref 0.7–4.0)
MCH: 28.4 pg (ref 26.0–34.0)
MCHC: 30.8 g/dL (ref 30.0–36.0)
MCV: 92.2 fL (ref 80.0–100.0)
Monocytes Absolute: 0.7 10*3/uL (ref 0.1–1.0)
Monocytes Relative: 10 %
Neutro Abs: 3.2 10*3/uL (ref 1.7–7.7)
Neutrophils Relative %: 42 %
Platelets: 252 10*3/uL (ref 150–400)
RBC: 4.64 MIL/uL (ref 3.87–5.11)
RDW: 13 % (ref 11.5–15.5)
WBC: 7.6 10*3/uL (ref 4.0–10.5)
nRBC: 0 % (ref 0.0–0.2)

## 2019-04-15 LAB — URINALYSIS, MICROSCOPIC (REFLEX)

## 2019-04-15 LAB — URINALYSIS, ROUTINE W REFLEX MICROSCOPIC
Bilirubin Urine: NEGATIVE
Glucose, UA: NEGATIVE mg/dL
Hgb urine dipstick: NEGATIVE
Ketones, ur: NEGATIVE mg/dL
Leukocytes,Ua: NEGATIVE
Nitrite: NEGATIVE
Protein, ur: 100 mg/dL — AB
Specific Gravity, Urine: 1.02 (ref 1.005–1.030)
pH: 6 (ref 5.0–8.0)

## 2019-04-15 MED ORDER — PREDNISONE 20 MG PO TABS
40.0000 mg | ORAL_TABLET | Freq: Once | ORAL | Status: AC
  Administered 2019-04-15: 40 mg via ORAL
  Filled 2019-04-15: qty 2

## 2019-04-15 MED ORDER — ALBUTEROL SULFATE HFA 108 (90 BASE) MCG/ACT IN AERS
2.0000 | INHALATION_SPRAY | Freq: Once | RESPIRATORY_TRACT | Status: AC
  Administered 2019-04-15: 23:00:00 2 via RESPIRATORY_TRACT
  Filled 2019-04-15: qty 6.7

## 2019-04-15 NOTE — ED Notes (Signed)
ED Provider at bedside. 

## 2019-04-15 NOTE — ED Triage Notes (Signed)
Patient state that she has had a cough for the 4 days - she reports that when she lays down she has wheezing in her chest and the cough is uncontrollable

## 2019-04-15 NOTE — ED Notes (Addendum)
Patient approached nursing desk stating that she would like to leave at present time due to her BP being elevated which is causing her to become very anxious; this RN asked patient if she is willing to stay and speak with MD; she states she will and is now back in her exam room; when further questioning her regarding anti-hypertensive medication she states "I don't have any insurance and have not taken my blood pressure medicine for years".

## 2019-04-15 NOTE — ED Notes (Signed)
Radiology at bedside

## 2019-04-16 LAB — BRAIN NATRIURETIC PEPTIDE: B Natriuretic Peptide: 58.5 pg/mL (ref 0.0–100.0)

## 2019-04-16 LAB — TROPONIN I (HIGH SENSITIVITY): Troponin I (High Sensitivity): 7 ng/L (ref <18)

## 2019-04-16 LAB — SARS CORONAVIRUS 2 AG (30 MIN TAT): SARS Coronavirus 2 Ag: NEGATIVE

## 2019-04-16 MED ORDER — PREDNISONE 20 MG PO TABS: ORAL_TABLET | ORAL | 0 refills | Status: AC

## 2019-04-16 MED ORDER — HYDROCODONE-HOMATROPINE 5-1.5 MG/5ML PO SYRP: 5.0000 mL | ORAL_SOLUTION | ORAL | 0 refills | Status: DC | PRN

## 2019-04-16 MED ORDER — LISINOPRIL 20 MG PO TABS: 20.0000 mg | ORAL_TABLET | Freq: Every day | ORAL | 1 refills | Status: AC

## 2019-04-16 MED ORDER — PREDNISONE 20 MG PO TABS: ORAL_TABLET | ORAL | 0 refills | Status: DC

## 2019-04-16 MED ORDER — HYDROCODONE-HOMATROPINE 5-1.5 MG/5ML PO SYRP: 5.0000 mL | ORAL_SOLUTION | ORAL | 0 refills | Status: AC | PRN

## 2019-04-16 MED ORDER — LISINOPRIL 10 MG PO TABS
20.0000 mg | ORAL_TABLET | Freq: Once | ORAL | Status: AC
  Administered 2019-04-16: 20 mg via ORAL
  Filled 2019-04-16: qty 2

## 2019-04-16 MED ORDER — LISINOPRIL 20 MG PO TABS: 20.0000 mg | ORAL_TABLET | Freq: Every day | ORAL | 1 refills | Status: DC

## 2019-04-16 NOTE — Discharge Instructions (Addendum)
1.  It is very important that you start seeing a family doctor again.  If you do not have one currently, use the discharge instructions referral number to find one.  You must resume surveillance and treatment for hypertension.  Leaving hypertension untreated results in severe complications such as stroke, heart attack and kidney failure. 2.  Your blood sugar is also slightly elevated.  Start following a diet for diabetes.  You will need further testing with your doctor. 3.  You have bronchitis with wheezing.  You have been given prednisone to take and an albuterol inhaler.  Use the inhaler 2 puffs every 4-6 hours for the next 2 to 3 days.  If your wheezing and coughing are improving you may use the inhaler as needed. 4.  Return to the emergency department for recheck if your symptoms are getting worse.

## 2019-04-16 NOTE — ED Provider Notes (Signed)
[facility] EMERGENCY DEPARTMENT Provider Note   CSN: [id number] Arrival date & time: 04/15/19  1947     History   Chief Complaint Chief Complaint  Patient presents with  . Cough    HPI Jessica Wiley is a 53 y.o. female.     HPI Patient reports that she has had a cough now for 4 days.  She reports the cough feels very tight and she is wheezing.  She reports is worse when she is lying down.  She reports she is coughed so much now that her abdomen is sore.  She has not had a fever.  She denies body aches.  She reports it started with some nasal congestion and sinus pressure and then started to "break up and move into her chest".  She denies any swelling in her legs or pain in her calves.  Patient reports she knows she has hypertension but she stopped taking her medication at least several years ago.  She reports that she just could not afford to keep going to the doctor to get her prescriptions.  Patient does not smoke.  She reports she quit over 23 years ago.  She reports she does drink about 10 beers per day. Past Medical History:  Diagnosis Date  . Gout   . Hypertension     There are no active problems to display for this patient.   History reviewed. No pertinent surgical history.   OB History   No obstetric history on file.      Home Medications    Prior to Admission medications   Medication Sig Start Date End Date Taking? Authorizing Provider  azithromycin (ZITHROMAX Z-PAK) 250 MG tablet Take 1 tablet (250 mg total) by mouth daily. 500mg  PO day 1, then 250mg  PO days 2-5 03/31/14   Szekalski, GurneeKaitlyn, PA-C  ciprofloxacin (CIPRO) 250 MG tablet Take 1 tablet (250 mg total) by mouth 2 (two) times daily. 10/11/16   Palumbo, April, MD  HYDROcodone-homatropine [facility](HYCODAN) 5-1.5 MG/5ML syrup Take 5 mLs by mouth every 6 (six) hours as needed. 03/31/14   Emilia BeckSzekalski, Kaitlyn, PA-C  lisinopril-hydrochlorothiazide (PRINZIDE,ZESTORETIC) 20-25 MG per tablet Take 1 tablet by  mouth daily.    [provider]  predniSONE (DELTASONE) 20 MG tablet Take 2 tablets (40 mg total) by mouth daily for 5 days. 04/20/18 04/25/18  Claude MangesSoto, Johana, PA-C    Family History History reviewed. No pertinent family history.  Social History Social History   Tobacco Use  . Smoking status: Former Games developermoker  . Smokeless tobacco: Never Used  Substance Use Topics  . Alcohol use: Yes    Comment: daily  . Drug use: Never     Allergies   Patient has no known allergies.   Review of Systems Review of Systems 10 Systems reviewed and are negative for acute change except as noted in the HPI.   Physical Exam Updated Vital Signs BP (!) 184/99 (BP Location: Right Arm)   Pulse 75   Temp 98.8 F (37.1 C) (Oral)   Resp 20   Ht 5\' 3"  (1.6 m)   Wt 90.7 kg   SpO2 95%   BMI 35.43 kg/m   Physical Exam Constitutional:      Comments: Patient is alert and nontoxic.  No respiratory distress at rest.  Eyes:     Extraocular Movements: Extraocular movements intact.     Conjunctiva/sclera: Conjunctivae normal.  Neck:     Musculoskeletal: Neck supple.  Cardiovascular:     Rate and Rhythm: Normal  rate and regular rhythm.  Pulmonary:     Comments: No respiratory distress.  Diffuse wheezing throughout lung fields.  Adequate airflow to the bases. Abdominal:     General: There is no distension.     Palpations: Abdomen is soft.     Tenderness: There is no abdominal tenderness. There is no guarding.  Musculoskeletal: Normal range of motion.        General: No swelling or tenderness.     Right lower leg: No edema.     Left lower leg: No edema.  Skin:    General: Skin is warm and dry.  Neurological:     General: No focal deficit present.     Mental Status: She is oriented to person, place, and time.     Coordination: Coordination normal.     Gait: Gait normal.  Psychiatric:        Mood and Affect: Mood normal.      ED Treatments / Results  Labs (all labs ordered are listed,  but only abnormal results are displayed) Labs Reviewed  BASIC METABOLIC PANEL - Abnormal; Notable for the following components:      Result Value   Glucose, Bld 174 (*)    All other components within normal limits  URINALYSIS, ROUTINE W REFLEX MICROSCOPIC - Abnormal; Notable for the following components:   Protein, ur 100 (*)    All other components within normal limits  URINALYSIS, MICROSCOPIC (REFLEX) - Abnormal; Notable for the following components:   Bacteria, UA RARE (*)    All other components within normal limits  SARS CORONAVIRUS 2 AG (30 MIN TAT)  BRAIN NATRIURETIC PEPTIDE  CBC WITH DIFFERENTIAL/PLATELET  TROPONIN I (HIGH SENSITIVITY)  TROPONIN I (HIGH SENSITIVITY)    EKG EKG Interpretation  Date/Time:  Sunday April 15 2019 23:46:03 EST Ventricular Rate:  91 PR Interval:    QRS Duration: 79 QT Interval:  384 QTC Calculation: 473 R Axis:   59 Text Interpretation: Sinus rhythm no change from previous Confirmed by Charlesetta Shanks [phone]) on 04/16/2019 1:07:34 AM   Radiology Dg Chest Port 1 View  Result Date: 04/15/2019 CLINICAL DATA:  Cough EXAM: PORTABLE CHEST 1 VIEW COMPARISON:  03/31/2014 FINDINGS: Heart and mediastinal contours are within normal limits. No focal opacities or effusions. No acute bony abnormality. IMPRESSION: Negative. Electronically Signed   By: Rolm Baptise M.D.   On: 04/15/2019 21:29    Procedures Procedures (including critical care time)  Medications Ordered in ED Medications  albuterol (VENTOLIN HFA) 108 (90 Base) MCG/ACT inhaler 2 puff (2 puffs Inhalation Given 04/15/19 2305)  predniSONE (DELTASONE) tablet 40 mg (40 mg Oral Given 04/15/19 2320)     Initial Impression / Assessment and Plan / ED Course  I have reviewed the triage vital signs and the nursing notes.  Pertinent labs & imaging results that were available during my care of the patient were reviewed by me and considered in my medical decision making (see chart for  details).        Patient presents with cough and wheezing.  This appears most consistent with acute bronchitis.  She reports first getting sinus congestion then chest congestion.  She reports after getting the albuterol here she has started to cough and produce the mucus.  She does feel better.  Chest x-ray does not show any focal consolidations to suggest pneumonia.  Covid testing is negative.  Patient is non-smoker but she has been noncompliant with management of hypertension.  She is hypertensive but without any  signs of endorgan damage.  Patient will be restarted on lisinopril.  She is also counseled on a mild elevation in her blood sugar and recommended to initiate a diabetic diet until she get set up with PCP for further testing.  Patient also reports drinking about 10 beers per day.  She is also counseled on necessity to start modifying alcohol consumption and trying to initiate exercise regimen.  Patient is in agreement and wishes to pursue lifestyle changes and resume primary care management.  Return precautions reviewed.  Final Clinical Impressions(s) / ED Diagnoses   Final diagnoses:  Bronchitis with wheezing  Essential hypertension    ED Discharge Orders    None       Arby Barrette, MD 04/16/19 0110

## 2019-04-16 NOTE — ED Notes (Signed)
ED Provider at bedside. 

## 2020-12-01 IMAGING — DX DG KNEE 1-2V*L*
2 series · 2 of 2 positions shown · non-contrast
Comparison: None.

CLINICAL DATA: Bilateral knee pain for the past month, greater on
the left. Some swelling. No known injury.

EXAM:
LEFT KNEE - 1-2 VIEW

[knee ap]
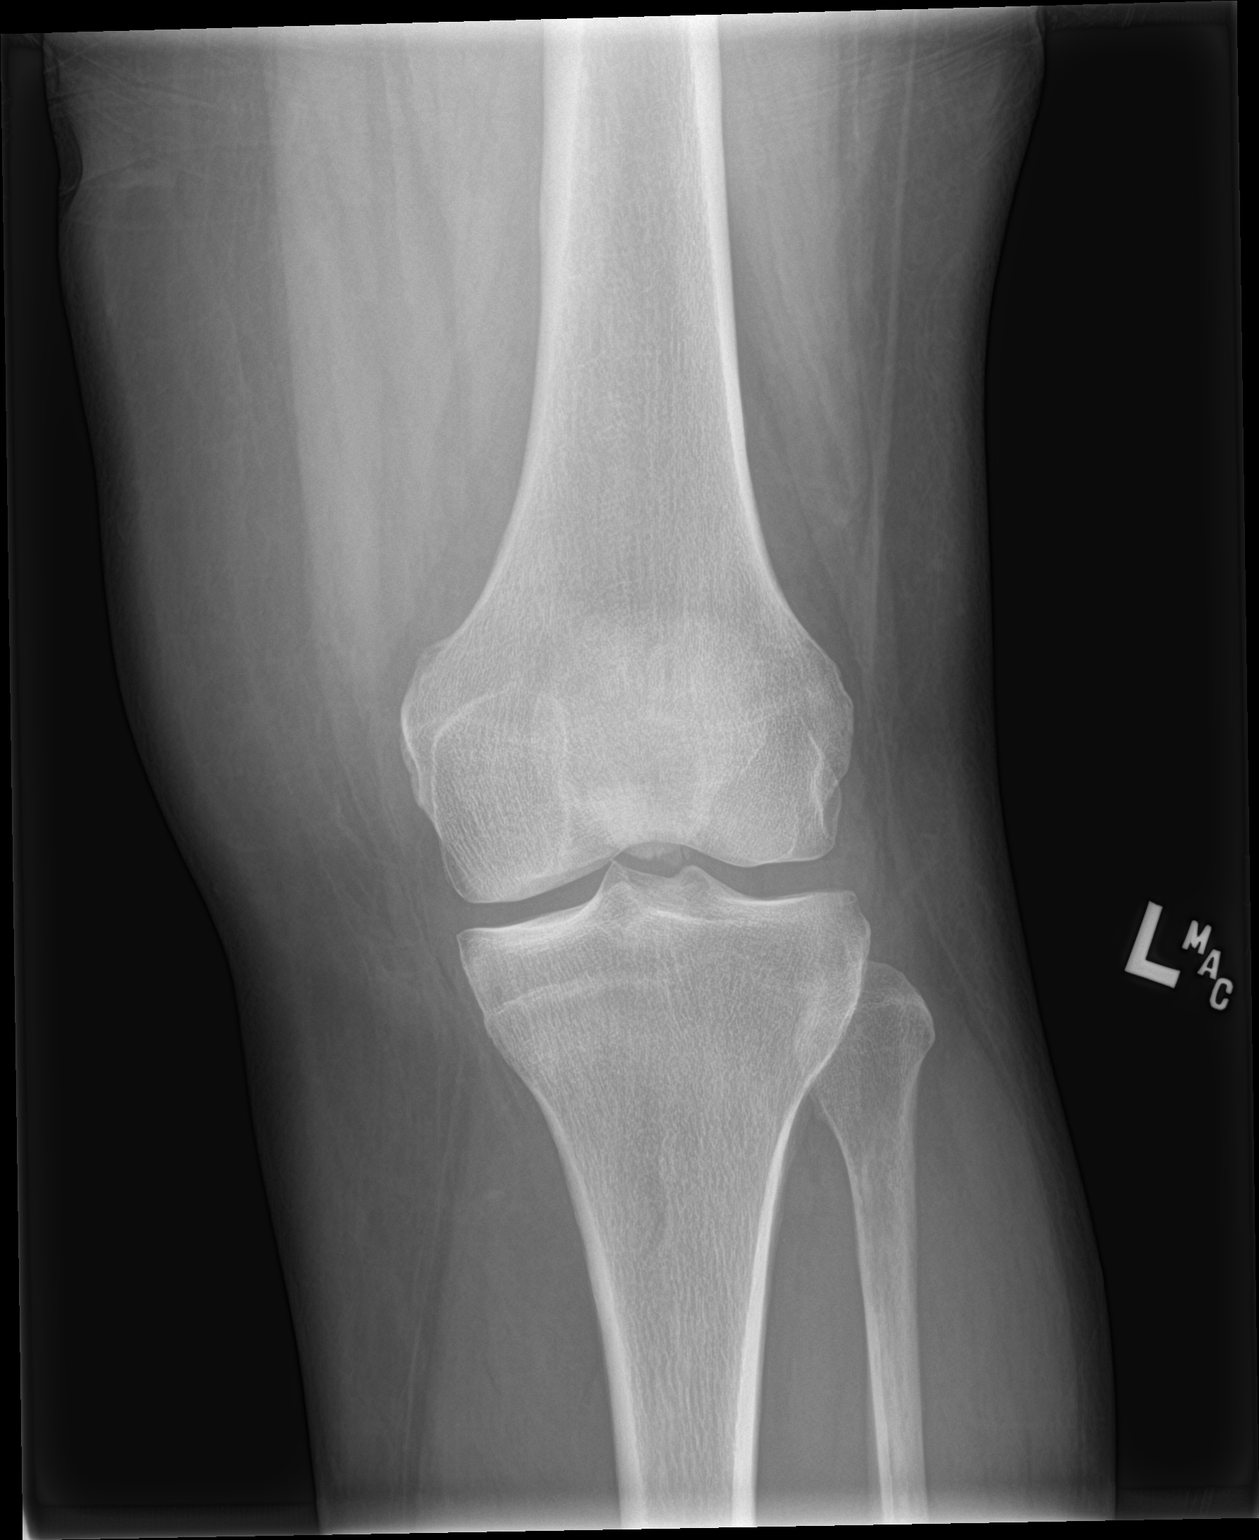

[knee lat]
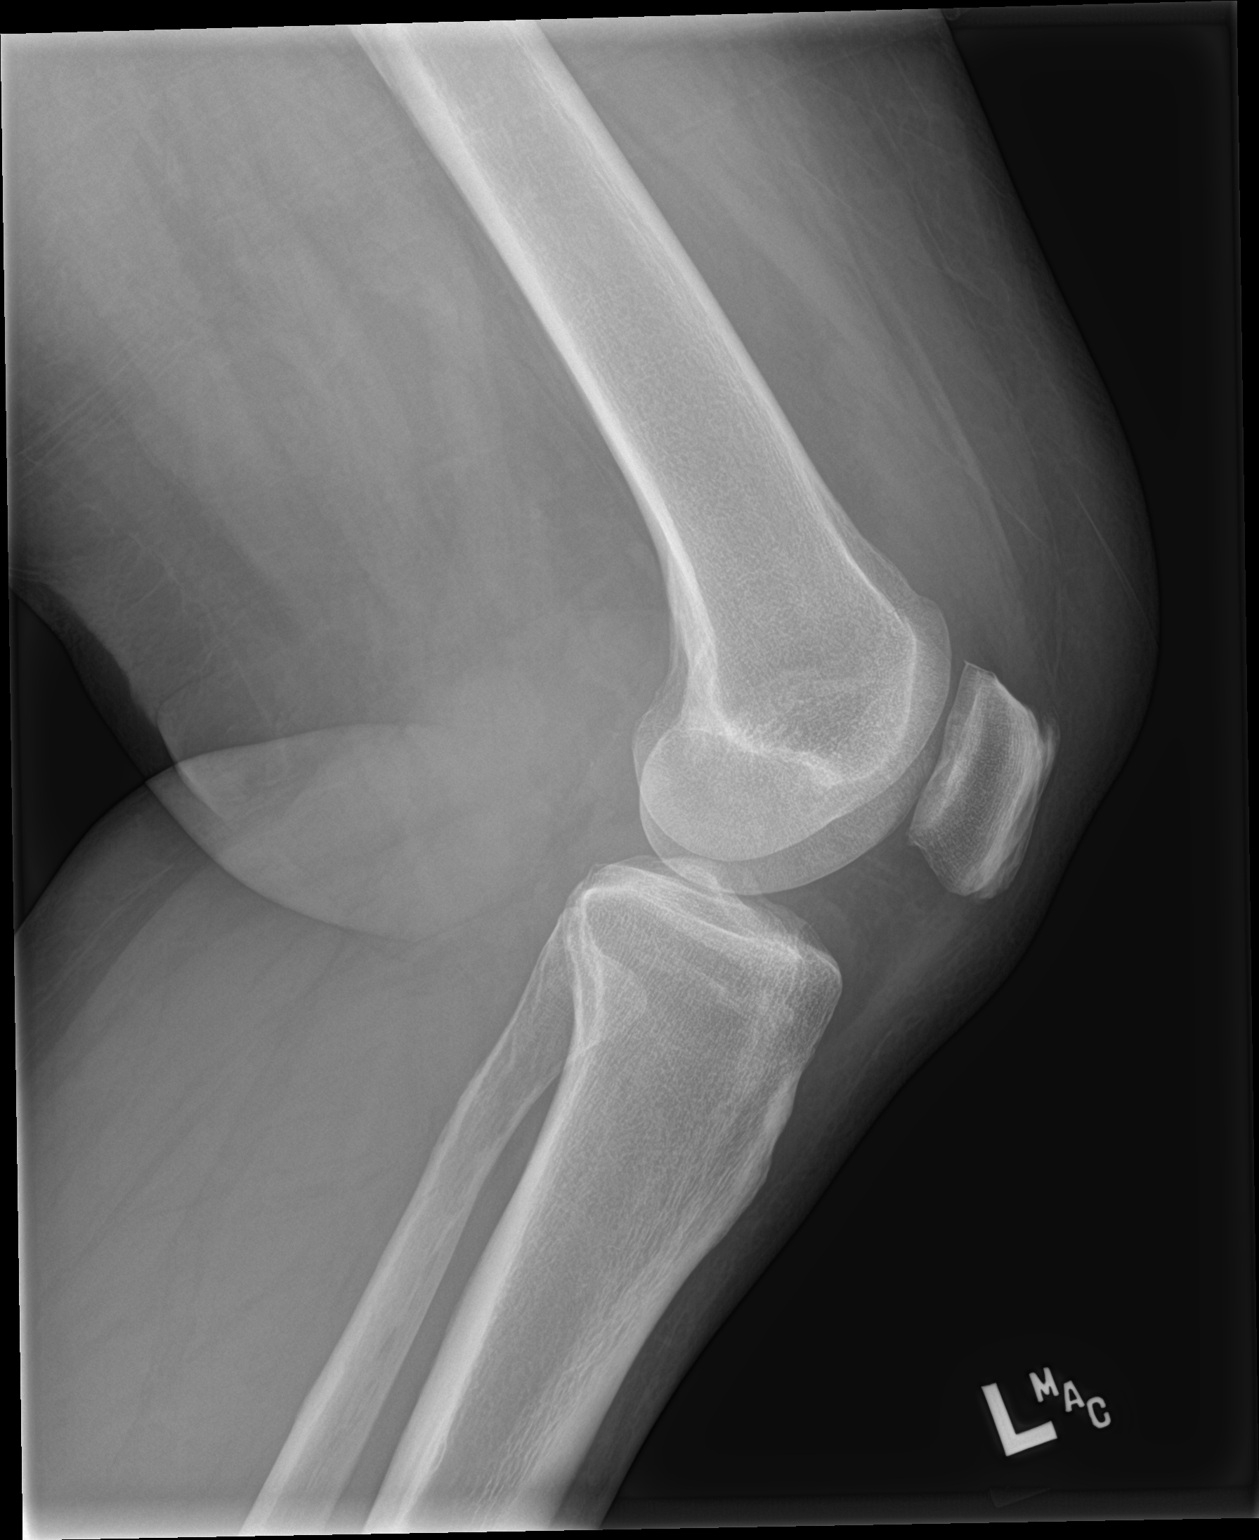

[2 of 2 positions shown; findings below may reference images not displayed]

FINDINGS: Minimal posterior patellar spur formation. Mild to moderate anterior
patellar enthesophytes. No effusion.
IMPRESSION: Minimal degenerative changes.

## 2021-11-26 IMAGING — DX DG CHEST 1V PORT
1 series · 1 of 1 positions shown · non-contrast
Comparison: 03/31/2014

CLINICAL DATA: Cough

EXAM:
PORTABLE CHEST 1 VIEW

[chest ap]
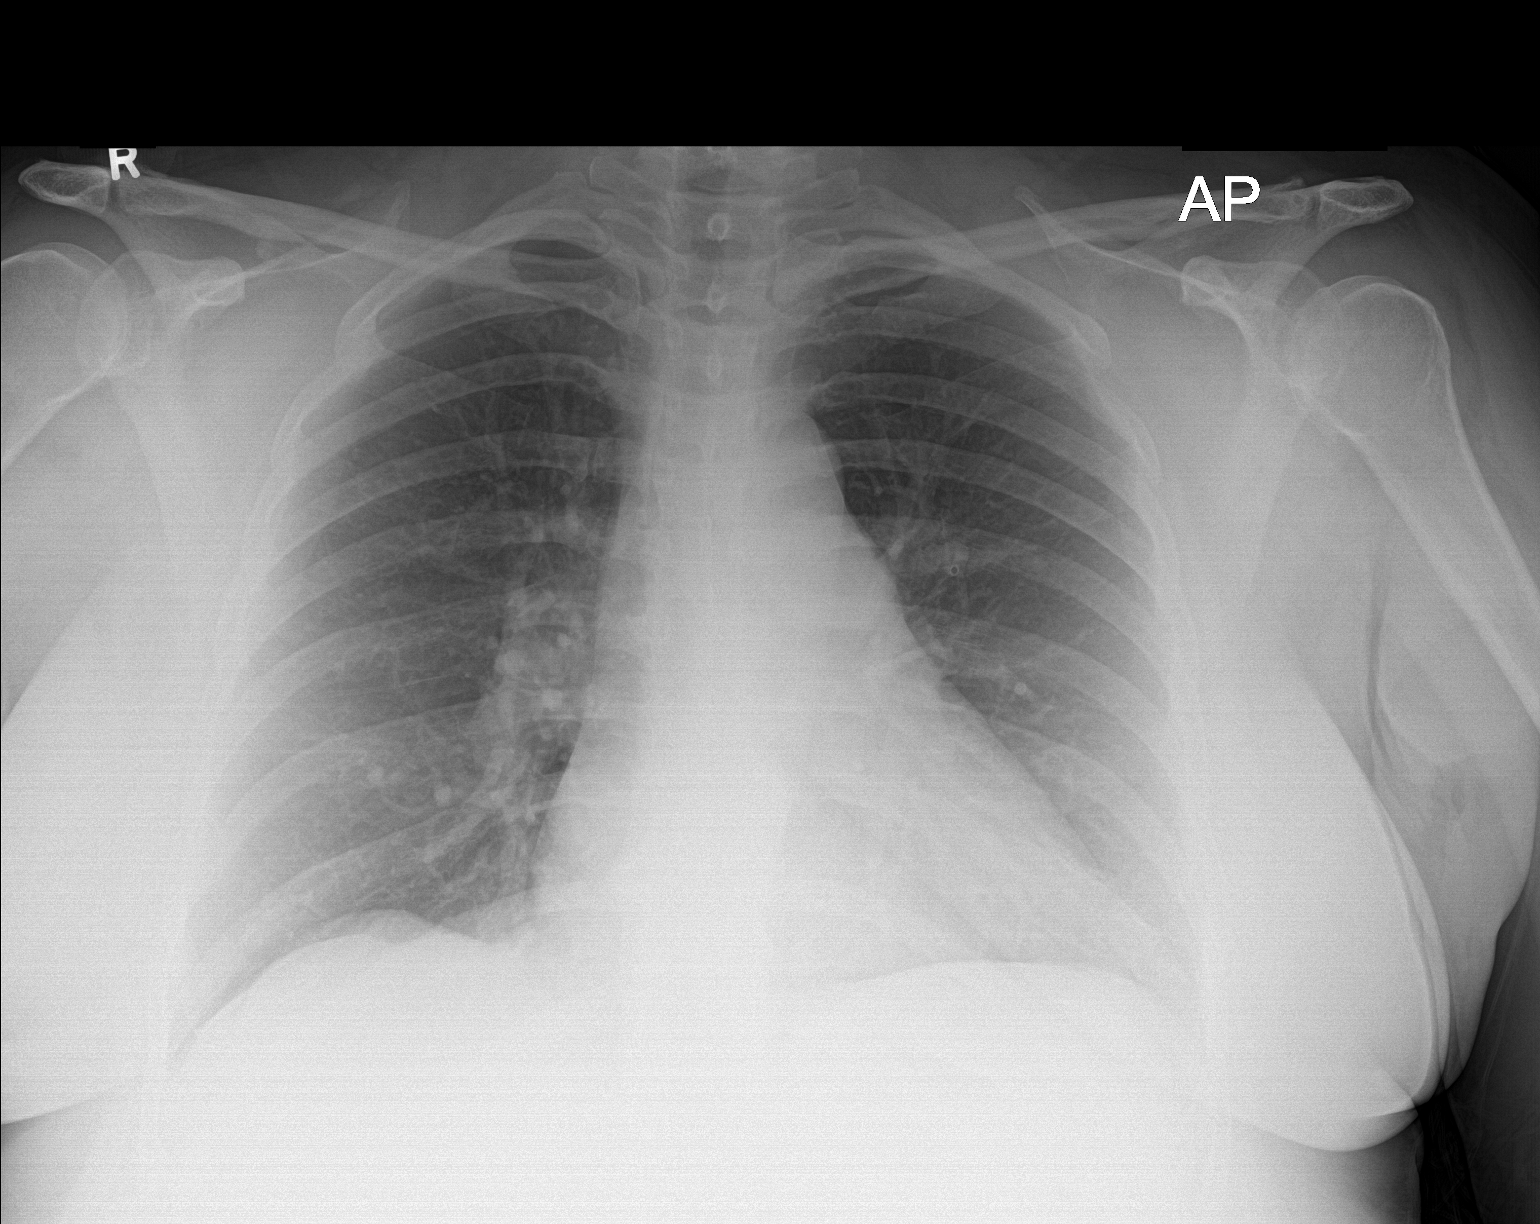

[1 of 1 positions shown; findings below may reference images not displayed]

FINDINGS: Heart and mediastinal contours are within normal limits. No focal
opacities or effusions. No acute bony abnormality.
IMPRESSION: Negative.
